# Patient Record
Sex: Male | Born: 1980 | Race: White | Hispanic: No | Marital: Married | Smoking: Never smoker
Health system: Southern US, Community
[De-identification: ages and names within clinical notes are randomized; demographics above are authoritative.]

---

## 2009-12-13 ENCOUNTER — Emergency Department (HOSPITAL_BASED_OUTPATIENT_CLINIC_OR_DEPARTMENT_OTHER): Admission: EM | Admit: 2009-12-13 | Discharge: 2009-12-13 | Payer: Self-pay | Admitting: Emergency Medicine

## 2010-11-01 ENCOUNTER — Emergency Department (HOSPITAL_COMMUNITY)
Admission: EM | Admit: 2010-11-01 | Discharge: 2010-11-02 | Disposition: A | Discharge status: Home / Self Care | Payer: Worker's Compensation | Attending: Emergency Medicine | Admitting: Emergency Medicine

## 2010-11-01 DIAGNOSIS — H9209 Otalgia, unspecified ear: Secondary | ICD-10-CM | POA: Insufficient documentation

## 2010-11-01 DIAGNOSIS — R51 Headache: Secondary | ICD-10-CM | POA: Insufficient documentation

## 2010-11-01 DIAGNOSIS — S0003XA Contusion of scalp, initial encounter: Secondary | ICD-10-CM | POA: Insufficient documentation

## 2010-11-01 DIAGNOSIS — S0990XA Unspecified injury of head, initial encounter: Secondary | ICD-10-CM | POA: Insufficient documentation

## 2010-11-01 DIAGNOSIS — M25559 Pain in unspecified hip: Secondary | ICD-10-CM | POA: Insufficient documentation

## 2010-11-01 DIAGNOSIS — IMO0002 Reserved for concepts with insufficient information to code with codable children: Secondary | ICD-10-CM | POA: Insufficient documentation

## 2010-11-01 DIAGNOSIS — S7010XA Contusion of unspecified thigh, initial encounter: Secondary | ICD-10-CM | POA: Insufficient documentation

## 2010-11-02 ENCOUNTER — Emergency Department (HOSPITAL_COMMUNITY): Payer: Worker's Compensation

## 2018-12-21 ENCOUNTER — Emergency Department (HOSPITAL_COMMUNITY)
Admission: EM | Admit: 2018-12-21 | Discharge: 2018-12-21 | Disposition: A | Discharge status: Home / Self Care | Payer: No Typology Code available for payment source | Attending: Emergency Medicine | Admitting: Emergency Medicine

## 2018-12-21 ENCOUNTER — Encounter (HOSPITAL_COMMUNITY): Payer: Self-pay

## 2018-12-21 ENCOUNTER — Emergency Department (HOSPITAL_COMMUNITY): Payer: No Typology Code available for payment source

## 2018-12-21 ENCOUNTER — Other Ambulatory Visit: Payer: Self-pay

## 2018-12-21 DIAGNOSIS — M25561 Pain in right knee: Secondary | ICD-10-CM | POA: Diagnosis not present

## 2018-12-21 NOTE — ED Provider Notes (Signed)
[facility] EMERGENCY DEPARTMENT Provider Note   CSN: [id number] Arrival date & time: 12/21/18  2024  History   Chief Complaint Chief Complaint  Patient presents with  . Knee Pain   HPI Keith Hubbard is a 38 y.o. male with past medical history who presents for evaluation of knee pain.  Patient is a Chartered certified accountant.  States they were searching a house when he bent down to check underneath the bed he bent down with his right knee and felt pain.  Patient states he heard a popping sensation felt like his kneecap went to the right.  Patient states when he went to stand he was unable to originally straighten his leg.  Patient states after a minute he was able to fully extend his leg and walk on it.  Rated his initial pain a 3/10.  Current pain a 2/10. Describes his pain as a throbbing sensation. Denies dislocation of Tibia/fibula. Did take 1000 mg of Tylenol at incident.  Patient states he did not want to come the emergency department, however he did report his knee injury to his superior and they want him to have this evaluated.  Denies fever, chills, nausea, vomiting, decreased range of motion, numbness or tingling, swelling, redness, warmth to his lower extremities.  Denies pain to femur, tibia, fibula.  Pain is located to the medial and lateral menisci of right knee.  Denies any additional aggravating or alleviating factors. Ambulatory without difficulty.  History obtained from patient.  No interpreter was used.    HPI  History reviewed. No pertinent past medical history.  There are no active problems to display for this patient.   History reviewed. No pertinent surgical history.      Home Medications    Prior to Admission medications   Not on File    Family History No family history on file.  Social History Social History   Tobacco Use  . Smoking status: Not on file  Substance Use Topics  . Alcohol use: Not on file  . Drug use: Not on file     Allergies    Patient has no allergy information on record.   Review of Systems Review of Systems  Constitutional: Negative.   HENT: Negative.   Respiratory: Negative.   Cardiovascular: Negative.   Gastrointestinal: Negative.   Genitourinary: Negative.   Musculoskeletal: Negative for arthralgias, back pain, gait problem, joint swelling, myalgias, neck pain and neck stiffness.       Right knee pain.  Skin: Negative.   Neurological: Negative.   All other systems reviewed and are negative.    Physical Exam Updated Vital Signs BP 118/85   Pulse 89   Temp 98.5 F (36.9 C) (Oral)   Resp 16   SpO2 95%   Physical Exam Vitals signs and nursing note reviewed.  Constitutional:      General: He is not in acute distress.    Appearance: He is well-developed. He is not ill-appearing, toxic-appearing or diaphoretic.     Comments: Standing in room initial evaluation.  No acute distress noted.  HENT:     Head: Atraumatic.  Eyes:     Pupils: Pupils are equal, round, and reactive to light.  Neck:     Musculoskeletal: Normal range of motion and neck supple.  Cardiovascular:     Rate and Rhythm: Normal rate and regular rhythm.  Pulmonary:     Effort: Pulmonary effort is normal. No respiratory distress.  Chest:     Comments: 2+ DP,  PT pulses bilaterally. Abdominal:     General: There is no distension.     Palpations: Abdomen is soft.  Musculoskeletal: Normal range of motion.     Comments: Full range of motion bilateral lower extremities with flexion, extension, plantarflexion and dorsiflexion.  Tenderness to medial lateral menisci of her right knee.  No tenderness over patella.  Patient is able to straight leg raise without difficulty.  No tenderness to popliteal fossa, femur, tibia, fibula.  No evidence of obvious deformity.  No tenderness over Achilles tendon.  Negative Thompson test.  Lower extremity compartments are soft.  Negative varus, valgus stress.  Negative anterior drawer.  Skin:     General: Skin is warm and dry.     Comments: No edema, erythema, ecchymosis or warmth.  No rashes or lesions.  Brisk capillary refill.  Neurological:     Mental Status: He is alert.     Comments: 5/5 right to bilateral lower extremities.  Ambulatory without difficulty.  Intact sensation.    ED Treatments / Results  Labs (all labs ordered are listed, but only abnormal results are displayed) Labs Reviewed - No data to display  EKG None  Radiology Dg Knee Complete 4 Views Right  Result Date: 12/21/2018 CLINICAL DATA:  Right knee pain, heard a pop. EXAM: RIGHT KNEE - COMPLETE 4+ VIEW COMPARISON:  None. FINDINGS: No evidence of fracture, dislocation, or joint effusion. No evidence of arthropathy or other focal bone abnormality. Soft tissues are unremarkable. IMPRESSION: Negative radiographs of the right knee. Electronically Signed   By: Narda Rutherford M.D.   On: 12/21/2018 21:21    Procedures Procedures (including critical care time)  Medications Ordered in ED Medications - No data to display   Initial Impression / Assessment and Plan / ED Course  I have reviewed the triage vital signs and the nursing notes.  Pertinent labs & imaging results that were available during my care of the patient were reviewed by me and considered in my medical decision making (see chart for details).  38 year old male appears otherwise well presents for evaluation of knee pain.  Afebrile, nonseptic, non-ill-appearing.  Bent down and felt a popping sensation to his patella to the right.  Was able to stand and ambulate after a minute.  Denies dislocation of tibia, fibula.  Normal musculoskeletal exam.  Neurovascularly intact.  No edema, erythema, ecchymosis or warmth.  2+ DP, PT pulses bilaterally.  He does have minimal tenderness to medial and lateral menisci of right knee.  Negative varus, valgus stress.  Negative anterior drawer.  Negative straight leg raise.  No tenderness to popliteal fossa or Achilles  tendon.  Negative Thompson test.  X-ray negative for acute fracture or dislocation. Low suspicion for patella tendon rupture, arterial injury from dislocation, fracture, dislocation.  Possible patella subluxation with spontaneous reduction? Lower extremity compartments are soft.  No evidence of compartment syndrome.  Will place patient in leg sleeve. RICE for symptomatic management.  Patient to follow-up with orthopedics if he has continued pain.  Patient hemodynamically stable and appropriate for DC home at this time.  I have discussed return precautions.  Patient voiced understanding and is agreeable for follow-up.     Final Clinical Impressions(s) / ED Diagnoses   Final diagnoses:  Acute pain of right knee    ED Discharge Orders    None       Minha Fulco A, PA-C 12/21/18 2147    Jacalyn Lefevre, MD 12/21/18 [id number]

## 2018-12-21 NOTE — ED Notes (Signed)
Patient transported to X-ray 

## 2018-12-21 NOTE — Discharge Instructions (Signed)
Evaluated today for knee pain.  Your x-ray was negative.  We have placed you in a knee sleeve.  I would suggest Tylenol and ibuprofen as needed for pain.  Also suggest ice and elevating your leg.  Follow-up with orthopedics if you continue to have symptoms.  Return to the ED for any new or worsening symptoms

## 2018-12-21 NOTE — ED Triage Notes (Signed)
Pt was kneeling on the ground when his right knee locked up on him and it felt weak. States when he turns his hips he feels a "pop" in his knee. States " I felt like my knee cap shifted". Pt ambulatory in triage.

## 2020-02-08 ENCOUNTER — Encounter (HOSPITAL_COMMUNITY): Payer: Self-pay | Admitting: *Deleted

## 2020-02-08 ENCOUNTER — Other Ambulatory Visit: Payer: Self-pay

## 2020-02-08 ENCOUNTER — Emergency Department (HOSPITAL_COMMUNITY)
Admission: EM | Admit: 2020-02-08 | Discharge: 2020-02-08 | Disposition: A | Discharge status: Home / Self Care | Payer: No Typology Code available for payment source | Attending: Emergency Medicine | Admitting: Emergency Medicine

## 2020-02-08 DIAGNOSIS — Z209 Contact with and (suspected) exposure to unspecified communicable disease: Secondary | ICD-10-CM

## 2020-02-08 DIAGNOSIS — Z7721 Contact with and (suspected) exposure to potentially hazardous body fluids: Secondary | ICD-10-CM | POA: Insufficient documentation
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
## 2020-02-08 NOTE — ED Notes (Signed)
PT WAITING BY ROOM 21
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
## 2020-10-17 IMAGING — DX RIGHT KNEE - COMPLETE 4+ VIEW
4 series · 4 of 4 positions shown · non-contrast
Comparison: None.

CLINICAL DATA: Right knee pain, heard a pop.

EXAM:
RIGHT KNEE - COMPLETE 4+ VIEW

[knee ap]
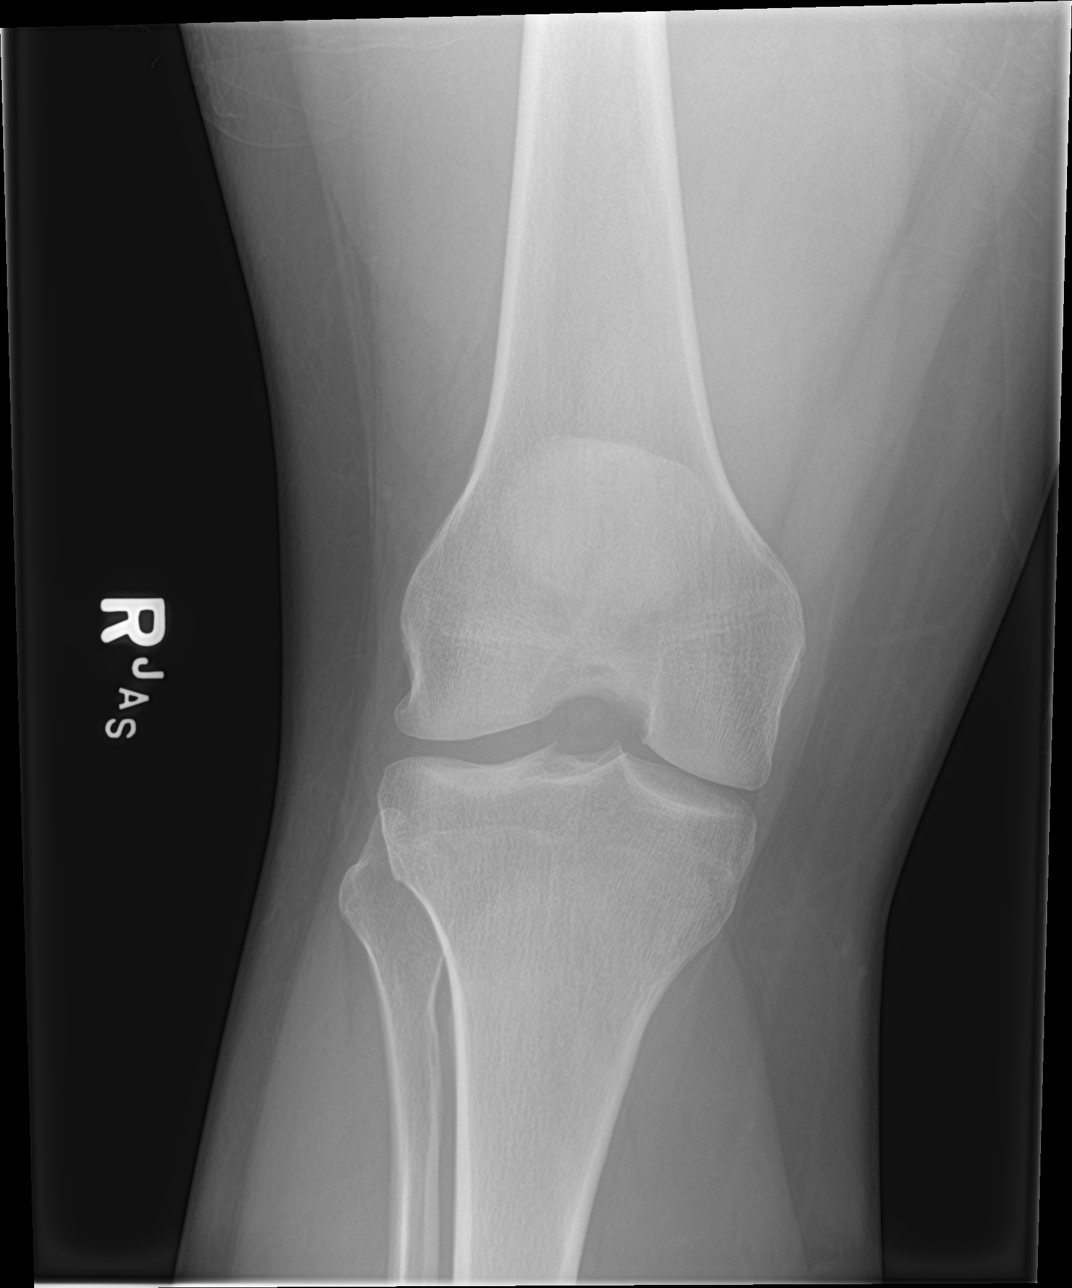

[knee lat]
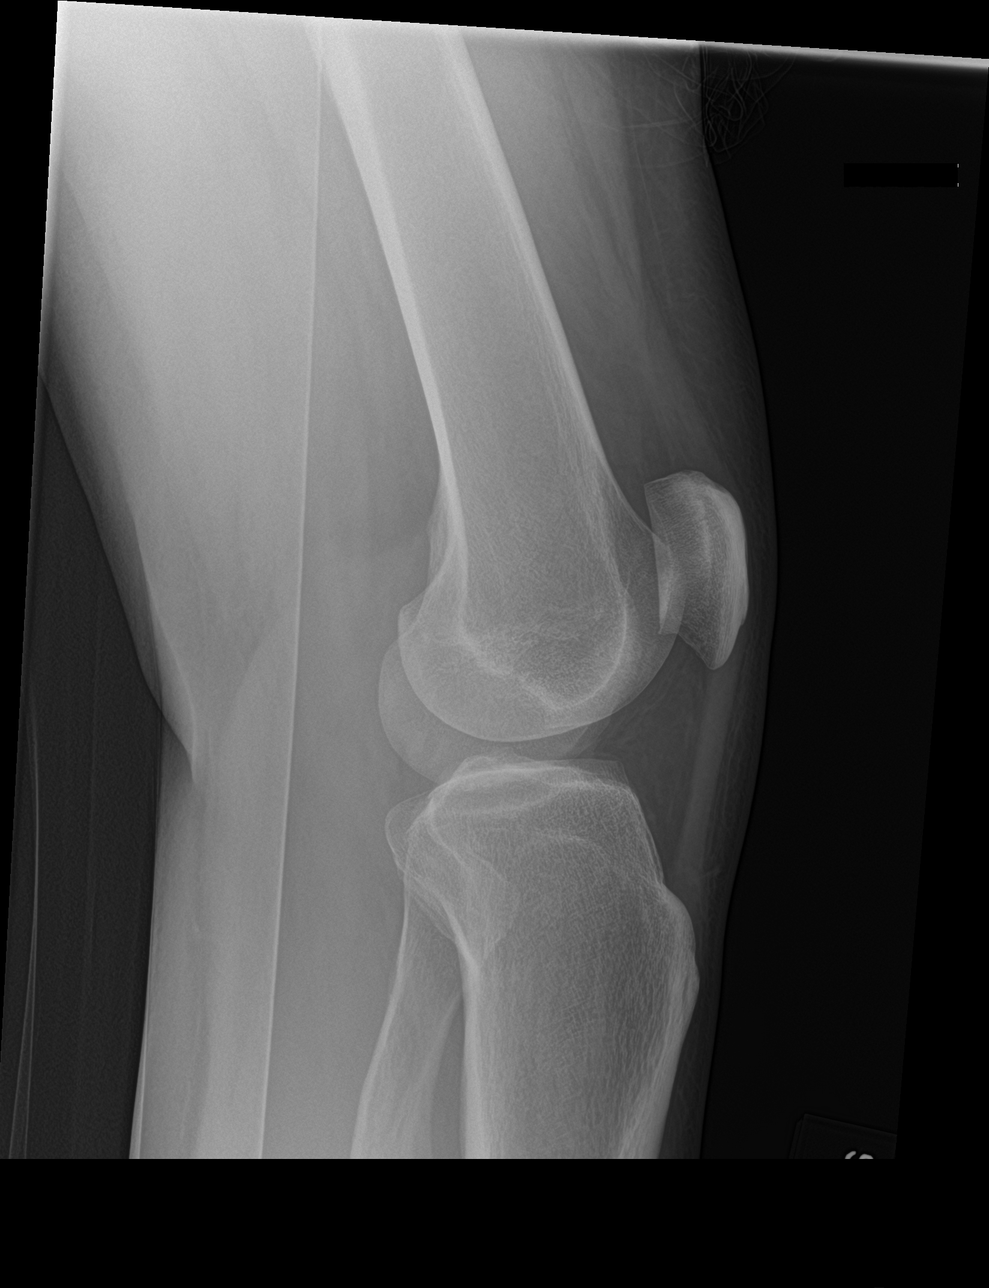

[knee obl (1 of 2)]
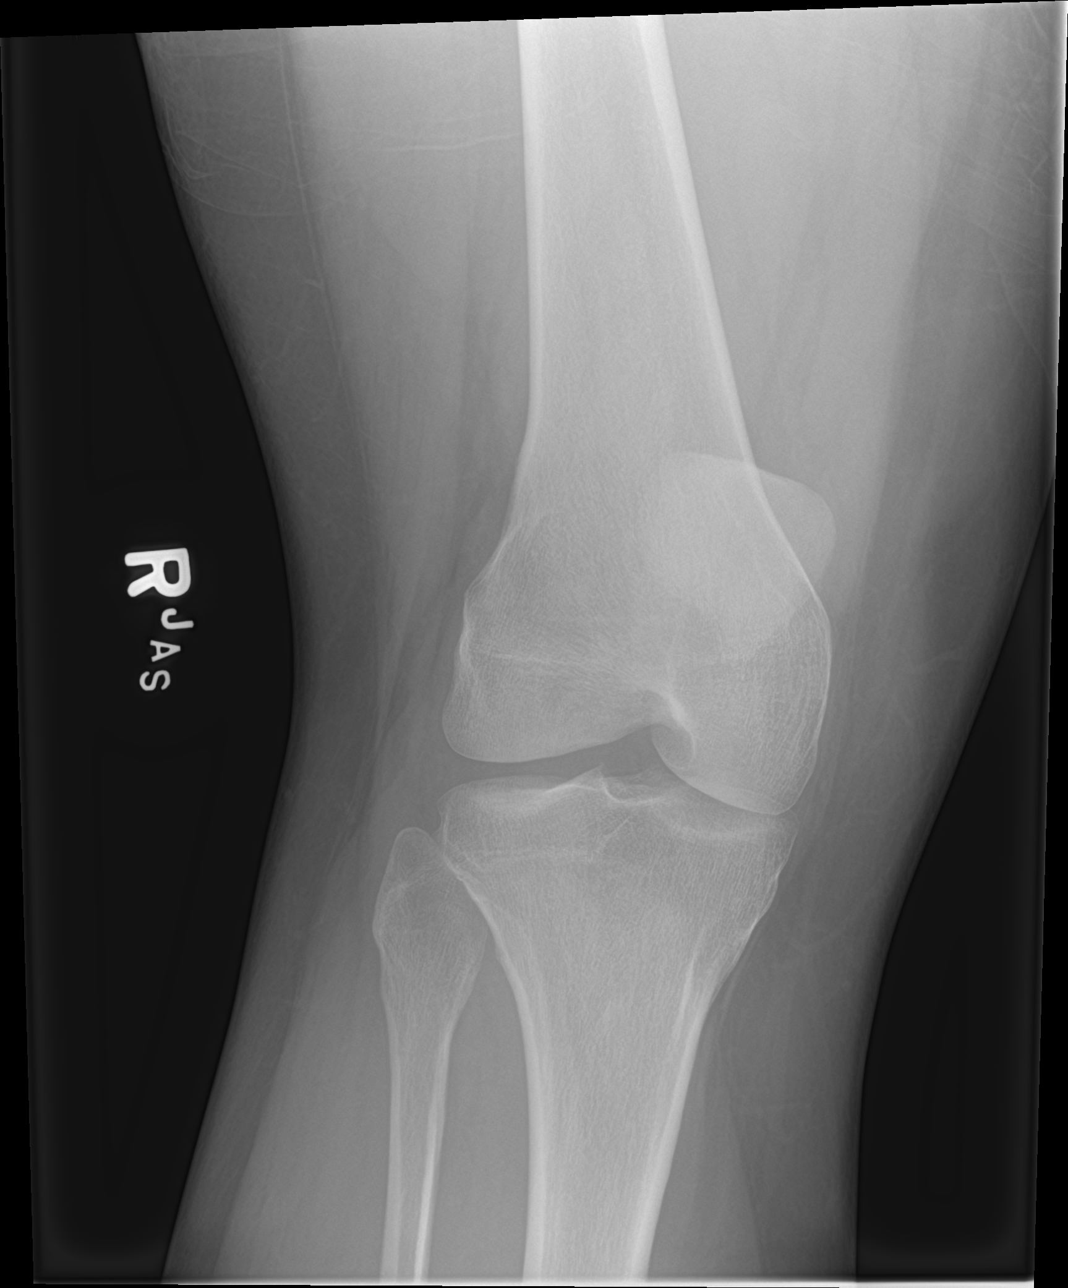

[knee obl (2 of 2)]
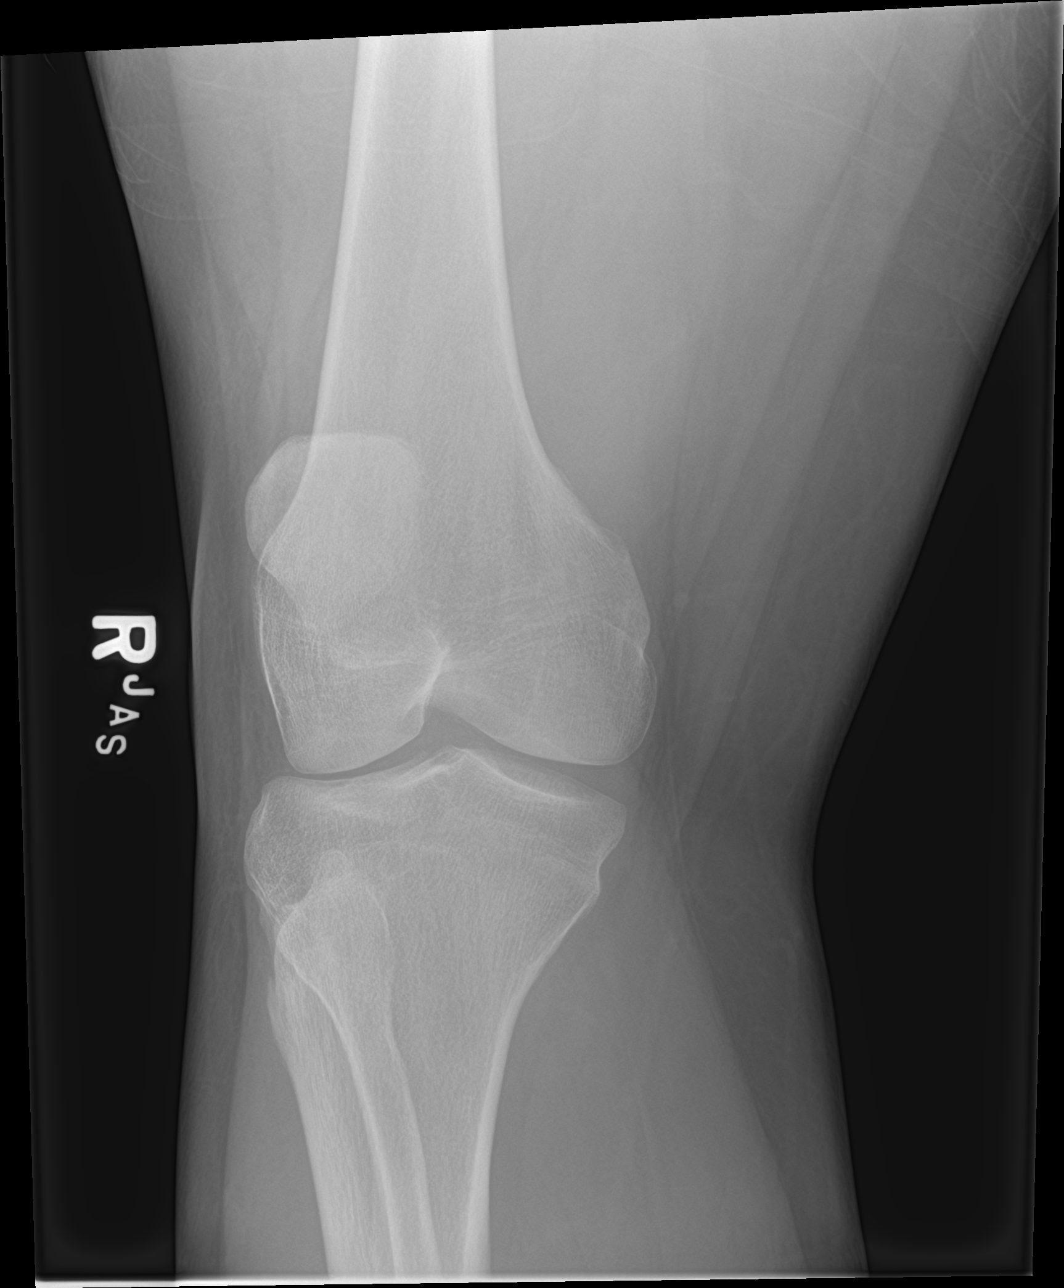

[4 of 4 positions shown; findings below may reference images not displayed]

FINDINGS: No evidence of fracture, dislocation, or joint effusion. No evidence
of arthropathy or other focal bone abnormality. Soft tissues are
unremarkable.
IMPRESSION: Negative radiographs of the right knee.
